# Patient Record
Sex: Female | Born: 1990
Health system: Southern US, Community
[De-identification: ages and names within clinical notes are randomized; demographics above are authoritative.]

## PROBLEM LIST (undated history)

## (undated) DIAGNOSIS — J45909 Unspecified asthma, uncomplicated: Secondary | ICD-10-CM

## (undated) HISTORY — DX: Unspecified asthma, uncomplicated: J45.909

---

## 2014-11-18 ENCOUNTER — Emergency Department (INDEPENDENT_AMBULATORY_CARE_PROVIDER_SITE_OTHER)
Admission: EM | Admit: 2014-11-18 | Discharge: 2014-11-18 | Disposition: A | Discharge status: Home / Self Care | Payer: Self-pay | Source: Home / Self Care | Attending: Emergency Medicine | Admitting: Emergency Medicine

## 2014-11-18 ENCOUNTER — Encounter (HOSPITAL_COMMUNITY): Payer: Self-pay | Admitting: Emergency Medicine

## 2014-11-18 DIAGNOSIS — M654 Radial styloid tenosynovitis [de Quervain]: Secondary | ICD-10-CM

## 2014-11-18 MED ORDER — DICLOFENAC SODIUM 1 % TD GEL: 2.0000 g | Freq: Four times a day (QID) | TRANSDERMAL | Status: DC

## 2014-11-18 MED ORDER — IBUPROFEN 600 MG PO TABS: 600.0000 mg | ORAL_TABLET | Freq: Four times a day (QID) | ORAL | Status: DC | PRN

## 2014-11-18 NOTE — Discharge Instructions (Signed)
De Quervain Tenosynovitis °Tendons attach muscles to bones. They also help with joint movements. When tendons become irritated or swollen, it is called tendinitis. °The extensor pollicis brevis (EPB) tendon connects the EPB muscle to a bone that is near the base of the thumb. The EPB muscle helps to straighten and extend the thumb. De Quervain tenosynovitis is a condition in which the EPB tendon lining (sheath) becomes irritated, thickened, and swollen. This condition is sometimes called stenosing tenosynovitis. This condition causes pain on the thumb side of the back of the wrist. °CAUSES °Causes of this condition include: °· Activities that repeatedly cause your thumb and wrist to extend. °· A sudden increase in activity or change in activity that affects your wrist. °RISK FACTORS: °This condition is more likely to develop in: °· Females. °· People who have diabetes. °· Women who have recently given birth. °· People who are over 40 years of age. °· People who do activities that involve repeated hand and wrist motions, such as tennis, racquetball, volleyball, gardening, and taking care of children. °· People who do heavy labor. °· People who have poor wrist strength and flexibility. °· People who do not warm up properly before activities. °SYMPTOMS °Symptoms of this condition include: °· Pain or tenderness over the thumb side of the back of the wrist when your thumb and wrist are not moving. °· Pain that gets worse when you straighten your thumb or extend your thumb or wrist. °· Pain when the injured area is touched. °· Locking or catching of the thumb joint while you bend and straighten your thumb. °· Decreased thumb motion due to pain. °· Swelling over the affected area. °DIAGNOSIS °This condition is diagnosed with a medical history and physical exam. Your health care provider will ask for details about your injury and ask about your symptoms. °TREATMENT °Treatment may include the use of icing and medicines to  reduce pain and swelling. You may also be advised to wear a splint or brace to limit your thumb and wrist motion. In less severe cases, treatment may also include working with a physical therapist to strengthen your wrist and calm the irritation around your EPB tendon sheath. In severe cases, surgery may be needed. °HOME CARE INSTRUCTIONS °If You Have a Splint or Brace: °· Wear it as told by your health care provider. Remove it only as told by your health care provider. °· Loosen the splint or brace if your fingers become numb and tingle, or if they turn cold and blue. °· Keep the splint or brace clean and dry. °Managing Pain, Stiffness, and Swelling  °· If directed, apply ice to the injured area. °¨ Put ice in a plastic bag. °¨ Place a towel between your skin and the bag. °¨ Leave the ice on for 20 minutes, 2-3 times per day. °· Move your fingers often to avoid stiffness and to lessen swelling. °· Raise (elevate) the injured area above the level of your heart while you are sitting or lying down. °General Instructions °· Return to your normal activities as told by your health care provider. Ask your health care provider what activities are safe for you. °· Take over-the-counter and prescription medicines only as told by your health care provider. °· Keep all follow-up visits as told by your health care provider. This is important. °· Do not drive or operate heavy machinery while taking prescription pain medicine. °SEEK MEDICAL CARE IF: °· Your pain, tenderness, or swelling gets worse, even if you have had   treatment. °· You have numbness or tingling in your wrist, hand, or fingers on the injured side. °  °This information is not intended to replace advice given to you by your health care provider. Make sure you discuss any questions you have with your health care provider. °  °Document Released: 12/19/2004 Document Revised: 09/09/2014 Document Reviewed: 02/24/2014 °Elsevier Interactive Patient Education ©2016  [employer] ° °

## 2014-11-18 NOTE — ED Provider Notes (Signed)
CSN: [id number]     Arrival date & time 11/18/14  1946 History   First MD Initiated Contact with Patient 11/18/14 2003     Chief Complaint  Patient presents with  . Wrist Pain   (Consider location/radiation/quality/duration/timing/severity/associated sxs/prior Treatment) HPI  She is a 24 year old woman here for evaluation of right wrist pain. She states this started about 2 weeks ago and has gradually been getting worse. The pain is located in the radial wrist and is worse with movements. She denies any injury or trauma. Her coworkers said it looked swollen today.  History reviewed. No pertinent past medical history. No past surgical history on file. History reviewed. No pertinent family history. Social History  Substance Use Topics  . Smoking status: None  . Smokeless tobacco: None  . Alcohol Use: None   OB History    No data available     Review of Systems As in history of present illness Allergies  Review of patient's allergies indicates no known allergies.  Home Medications   Prior to Admission medications   Medication Sig Start Date End Date Taking? Authorizing Provider  diclofenac sodium (VOLTAREN) 1 % GEL Apply 2 g topically 4 (four) times daily. 11/18/14   Charm RingsErin J Eloise Mula, MD  ibuprofen (ADVIL,MOTRIN) 600 MG tablet Take 1 tablet (600 mg total) by mouth every 6 (six) hours as needed. 11/18/14   Charm RingsErin J Mekhia Brogan, MD   Meds Ordered and Administered this Visit  Medications - No data to display  BP 124/81 mmHg  Pulse 95  Temp(Src) 98.2 F (36.8 C) (Oral)  Resp 16  SpO2 100%  LMP 11/18/2014 (Exact Date) No data found.   Physical Exam  Constitutional: She is oriented to person, place, and time. She appears well-developed and well-nourished. No distress.  Cardiovascular: Normal rate.   Pulmonary/Chest: Effort normal.  Musculoskeletal:  Right wrist: There is some swelling just distal to the radial styloid. She is tender just distal to the radial styloid. Positive  Finkelstein's. Negative Tinel's. She has full active range of motion of the wrist.  Neurological: She is alert and oriented to person, place, and time.    ED Course  Procedures (including critical care time)  Labs Review Labs Reviewed - No data to display  Imaging Review No results found.    MDM   1. De Quervain's tenosynovitis, right    Thumb spica brace given. Treat with Voltaren gel as well as ibuprofen as needed for pain. If not improving in the next week, follow-up at sports medicine for additional evaluation.    Charm RingsErin J Dayshaun Whobrey, MD 11/18/14 [id number]

## 2014-11-18 NOTE — ED Notes (Signed)
[facility] with a complaint of right wrist pain that started about 2 weeks ago and the patient denied any known trauma to the wrist.

## 2015-11-18 ENCOUNTER — Encounter: Payer: Self-pay | Admitting: Family Medicine

## 2015-11-18 ENCOUNTER — Ambulatory Visit (INDEPENDENT_AMBULATORY_CARE_PROVIDER_SITE_OTHER): Payer: Managed Care, Other (non HMO) | Admitting: Family Medicine

## 2015-11-18 VITALS — BP 120/72 | HR 98 | Temp 98.9°F | Resp 18 | Ht 62.5 in | Wt 179.0 lb

## 2015-11-18 DIAGNOSIS — H1032 Unspecified acute conjunctivitis, left eye: Secondary | ICD-10-CM

## 2015-11-18 MED ORDER — ERYTHROMYCIN 5 MG/GM OP OINT: 1.0000 "application " | TOPICAL_OINTMENT | Freq: Two times a day (BID) | OPHTHALMIC | 0 refills | Status: AC

## 2015-11-18 MED ORDER — ERYTHROMYCIN 5 MG/GM OP OINT: 1.0000 "application " | TOPICAL_OINTMENT | Freq: Two times a day (BID) | OPHTHALMIC | 0 refills | Status: DC

## 2015-11-18 NOTE — Progress Notes (Signed)
   Patient ID: Joy RabonJaquesha Detty, female    DOB: 10/09/1990, 25 y.o.   MRN: [id number]  PCP: No PCP Per Patient  Chief Complaint  Patient presents with  . Conjunctivitis    Subjective:   HPI 25 year old female presents for evaluation of left eye redness with crusting x 1 day. [facility] is [facility]. Patient reports waking up this morning with her eye completely crusted shut.  She wiped the crusting away from her left eye and noticed that it was red. Returned to sleep and awaken again to completely crusted and closed left eye. Denies sensation of a foreign body,  any itching or draining. Reports more a mild tenderness..  Social History   Social History  . Marital status: Single    Spouse name: N/A  . Number of children: N/A  . Years of education: N/A   Occupational History  . Not on file.   Social History Main Topics  . Smoking status: Never Smoker  . Smokeless tobacco: Never Used  . Alcohol use No  . Drug use: No  . Sexual activity: Not on file   Other Topics Concern  . Not on file   Social History Narrative  . No narrative on file    No family history on file.   Review of Systems See HPI There are no active problems to display for this patient.   Prior to Admission medications   Not on File   No Known Allergies     Objective:  Physical Exam  Constitutional: She is oriented to person, place, and time. She appears well-developed and well-nourished.  HENT:  Head: Normocephalic and atraumatic.  Right Ear: External ear normal.  Left Ear: External ear normal.  Nose: Nose normal.  Mouth/Throat: Oropharynx is clear and moist.  Eyes: Pupils are equal, round, and reactive to light. Left eye exhibits discharge. Left conjunctiva is injected.  Erythematous sclera. Greenish discharge noted inner canthus of left eye.  Neck: Normal range of motion. Neck supple.  Musculoskeletal: Normal range of motion.  Lymphadenopathy:    She has no cervical adenopathy.  Neurological:  She is alert and oriented to person, place, and time.  Skin: Skin is warm and dry.  Psychiatric: She has a normal mood and affect. Her behavior is normal. Judgment and thought content normal.   Vitals:   11/18/15 1045  BP: 120/72  Pulse: 98  Resp: 18  Temp: 98.9 F (37.2 C)     Assessment & Plan:  1. Acute bacterial conjunctivitis of left eye Plan:  Erythromycin (Romycin) opthalmic ointment, 1 application, 2 times daily.  Return for care as needed.  Godfrey PickKimberly S. Tiburcio PeaHarris, MSN, FNP-C [facility]

## 2015-11-18 NOTE — Patient Instructions (Addendum)
Apply 1 band of eye ointment to the left eye twice daily for 7 days.  Apply warm compresses to decrease swelling.  Wash hands frequently to prevent cross contamination to unaffected eye.    IF you received an x-ray today, you will receive an invoice from [facility]. Please contact [facility] at [phone] with questions or concerns regarding your invoice.   IF you received labwork today, you will receive an invoice from [facility]. Please contact [facility] at [phone] with questions or concerns regarding your invoice.   Our billing staff will not be able to assist you with questions regarding bills from these companies.  You will be contacted with the lab results as soon as they are available. The fastest way to get your results is to activate your My Chart account. Instructions are located on the last page of this paperwork. If you have not heard from us regarding the results in 2 weeks, please contact this office.     Bacterial Conjunctivitis Introduction Bacterial conjunctivitis is an infection of your conjunctiva. This is the clear membrane that covers the white part of your eye and the inner surface of your eyelid. This condition can make your eye:  Red or pink.  Itchy. This condition is caused by bacteria. This condition spreads very easily from person to person (is contagious) and from one eye to the other eye. Follow these instructions at home: Medicines  Take or apply your antibiotic medicine as told by your doctor. Do not stop taking or applying the antibiotic even if you start to feel better.  Take or apply over-the-counter and prescription medicines only as told by your doctor.  Do not touch your eyelid with the eye drop bottle or the ointment tube. Managing discomfort  Wipe any fluid from your eye with a warm, wet washcloth or a cotton ball.  Place a cool, clean washcloth on your eye. Do this for 10-20 minutes, 3-4  times per day. General instructions  Do not wear contact lenses until the irritation is gone. Wear glasses until your doctor says it is okay to wear contacts.  Do not wear eye makeup until your symptoms are gone. Throw away any old makeup.  Change or wash your pillowcase every day.  Do not share towels or washcloths with anyone.  Wash your hands often with soap and water. Use paper towels to dry your hands.  Do not touch or rub your eyes.  Do not drive or use heavy machinery if your vision is blurry. Contact a doctor if:  You have a fever.  Your symptoms do not get better after 10 days. Get help right away if:  You have a fever and your symptoms suddenly get worse.  You have very bad pain when you move your eye.  Your face:  Hurts.  Is red.  Is swollen.  You have sudden loss of vision. This information is not intended to replace advice given to you by your health care provider. Make sure you discuss any questions you have with your health care provider. Document Released: 09/28/2007 Document Revised: 05/27/2015 Document Reviewed: 10/01/2014  2017 Elsevier

## 2017-08-10 DIAGNOSIS — Z202 Contact with and (suspected) exposure to infections with a predominantly sexual mode of transmission: Secondary | ICD-10-CM | POA: Diagnosis not present

## 2017-08-10 DIAGNOSIS — R21 Rash and other nonspecific skin eruption: Secondary | ICD-10-CM | POA: Diagnosis not present

## 2017-08-10 DIAGNOSIS — Z3202 Encounter for pregnancy test, result negative: Secondary | ICD-10-CM | POA: Diagnosis not present

## 2018-12-02 ENCOUNTER — Other Ambulatory Visit: Payer: Self-pay

## 2018-12-02 DIAGNOSIS — Z20822 Contact with and (suspected) exposure to covid-19: Secondary | ICD-10-CM

## 2018-12-03 LAB — NOVEL CORONAVIRUS, NAA: SARS-CoV-2, NAA: NOT DETECTED

## 2019-01-24 ENCOUNTER — Ambulatory Visit: Payer: 59 | Attending: Internal Medicine

## 2019-01-24 DIAGNOSIS — Z20822 Contact with and (suspected) exposure to covid-19: Secondary | ICD-10-CM

## 2019-01-25 LAB — NOVEL CORONAVIRUS, NAA: SARS-CoV-2, NAA: DETECTED — AB

## 2019-03-21 ENCOUNTER — Ambulatory Visit: Payer: 59 | Attending: Internal Medicine

## 2019-03-21 DIAGNOSIS — Z23 Encounter for immunization: Secondary | ICD-10-CM

## 2019-03-21 NOTE — Progress Notes (Signed)
   Covid-19 Vaccination Clinic  Name:  Joy Powell    MRN: [id number] DOB: 09/10/1990  03/21/2019  [name] was observed post Covid-19 immunization for 15 minutes without incident. She was provided with Vaccine Information Sheet and instruction to access the V-Safe system.   [name] was instructed to call 911 with any severe reactions post vaccine: Marland Kitchen Difficulty breathing  . Swelling of face and throat  . A fast heartbeat  . A bad rash all over body  . Dizziness and weakness   Immunizations Administered    Name Date Dose VIS Date Route   Pfizer COVID-19 Vaccine 03/21/2019  8:40 AM 0.3 mL 12/13/2018 Intramuscular   Manufacturer: [employer], [employer]   Lot: [id number]   NDC: [address]-3

## 2019-04-15 ENCOUNTER — Ambulatory Visit: Payer: 59 | Attending: Internal Medicine

## 2019-04-15 DIAGNOSIS — Z23 Encounter for immunization: Secondary | ICD-10-CM

## 2019-04-15 NOTE — Progress Notes (Signed)
   Covid-19 Vaccination Clinic  Name:  Kai Railsback    MRN: [id number] DOB: May 31, 1990  04/15/2019  Ms. Connell was observed post Covid-19 immunization for 15 minutes without incident. She was provided with Vaccine Information Sheet and instruction to access the V-Safe system.   Ms. Hora was instructed to call 911 with any severe reactions post vaccine: Marland Kitchen Difficulty breathing  . Swelling of face and throat  . A fast heartbeat  . A bad rash all over body  . Dizziness and weakness   Immunizations Administered    Name Date Dose VIS Date Route   Pfizer COVID-19 Vaccine 04/15/2019  2:27 PM 0.3 mL 12/13/2018 Intramuscular   Manufacturer: [employer], [employer]   Lot: [id number]   NDC: [address]-2

## 2019-08-26 DIAGNOSIS — Z3009 Encounter for other general counseling and advice on contraception: Secondary | ICD-10-CM | POA: Diagnosis not present

## 2019-08-26 DIAGNOSIS — Z3202 Encounter for pregnancy test, result negative: Secondary | ICD-10-CM | POA: Diagnosis not present

## 2019-08-26 DIAGNOSIS — R69 Illness, unspecified: Secondary | ICD-10-CM | POA: Diagnosis not present

## 2020-01-04 DIAGNOSIS — Z20822 Contact with and (suspected) exposure to covid-19: Secondary | ICD-10-CM | POA: Diagnosis not present

## 2020-01-07 DIAGNOSIS — Z20822 Contact with and (suspected) exposure to covid-19: Secondary | ICD-10-CM | POA: Diagnosis not present

## 2020-03-26 DIAGNOSIS — R69 Illness, unspecified: Secondary | ICD-10-CM | POA: Diagnosis not present

## 2020-03-26 DIAGNOSIS — Z01419 Encounter for gynecological examination (general) (routine) without abnormal findings: Secondary | ICD-10-CM | POA: Diagnosis not present

## 2020-03-26 DIAGNOSIS — Z113 Encounter for screening for infections with a predominantly sexual mode of transmission: Secondary | ICD-10-CM | POA: Diagnosis not present

## 2021-04-04 ENCOUNTER — Other Ambulatory Visit: Payer: Self-pay | Admitting: Obstetrics and Gynecology

## 2021-04-04 DIAGNOSIS — N6320 Unspecified lump in the left breast, unspecified quadrant: Secondary | ICD-10-CM

## 2021-04-06 ENCOUNTER — Ambulatory Visit
Admission: RE | Admit: 2021-04-06 | Discharge: 2021-04-06 | Disposition: A | Discharge status: Home / Self Care | Payer: 59 | Source: Ambulatory Visit | Attending: Obstetrics and Gynecology | Admitting: Obstetrics and Gynecology

## 2021-04-06 DIAGNOSIS — N6320 Unspecified lump in the left breast, unspecified quadrant: Secondary | ICD-10-CM

## 2023-03-27 IMAGING — US US BREAST*L* LIMITED INC AXILLA
1 series · 14 of 16 positions shown · non-contrast
Comparison: None.

CLINICAL DATA: Palpable lump within the periareolar LEFT breast.

EXAM:
ULTRASOUND OF THE LEFT BREAST

[Series 1: us breast*left* limited inc axilla · 0.06mm/px · 14 of 16 slices shown]
[im 1/16]
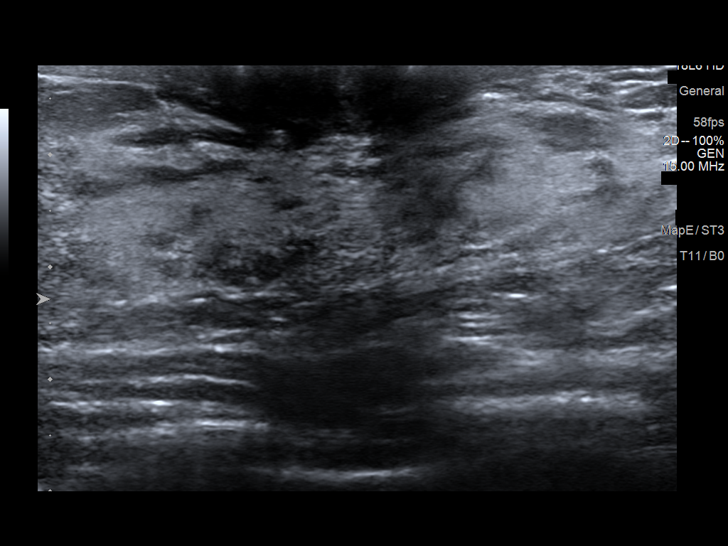
[im 2/16]
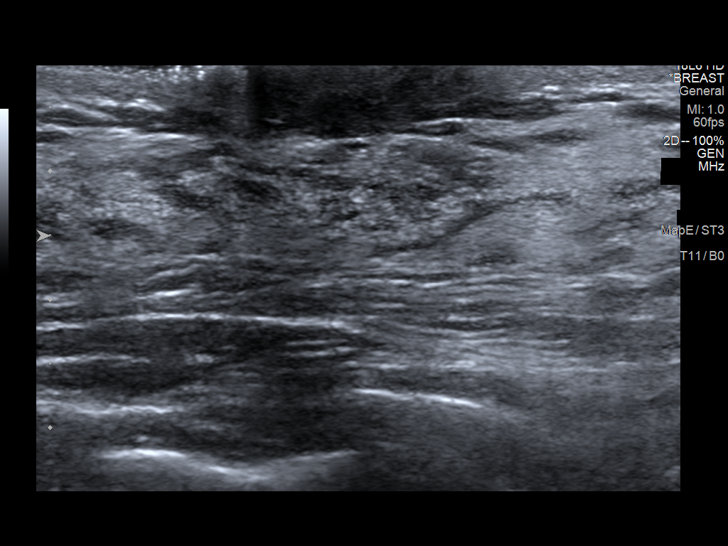
[im 3/16]
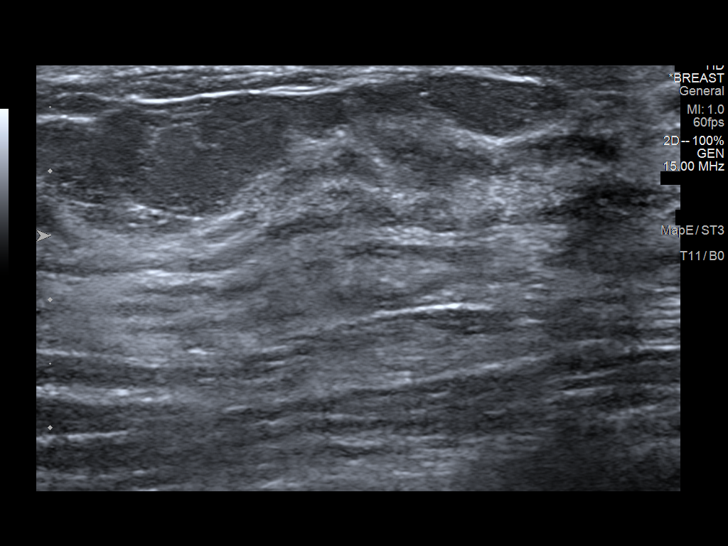
[im 5/16]
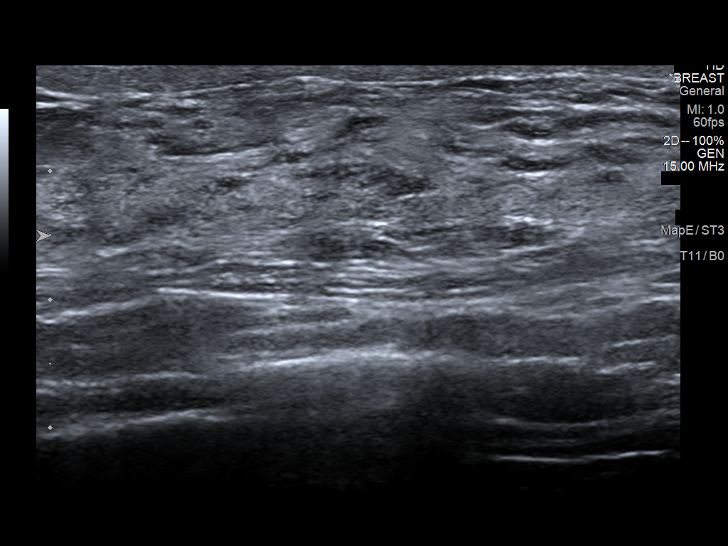
[im 6/16]
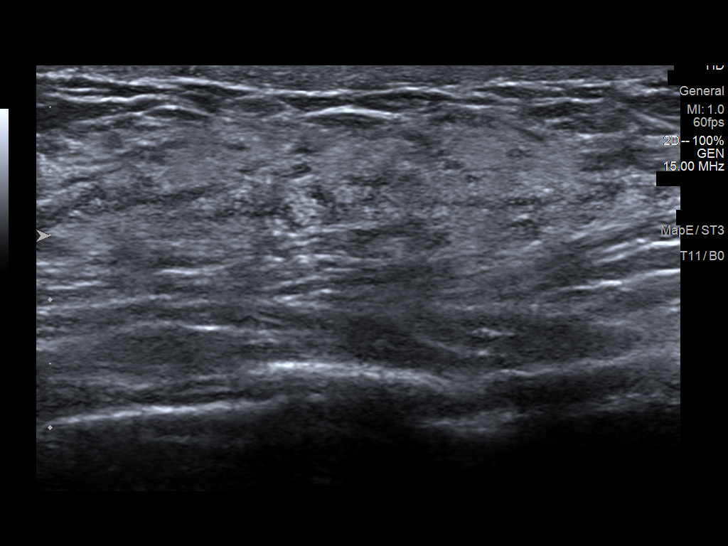
[im 7/16]
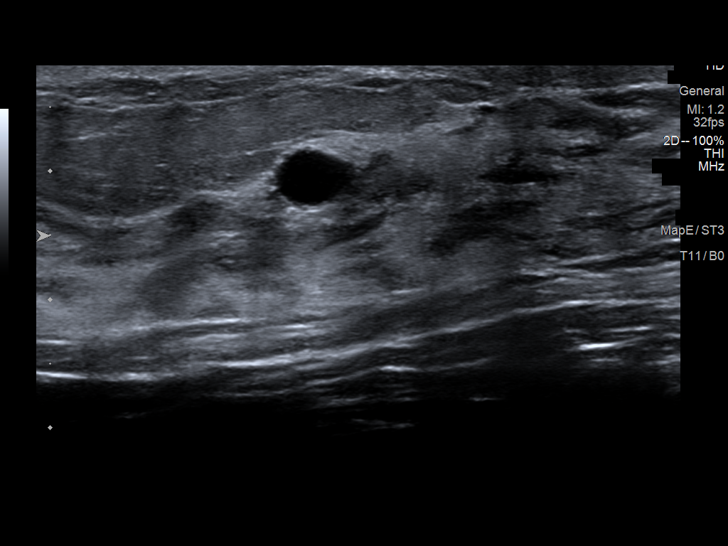
[im 8/16]
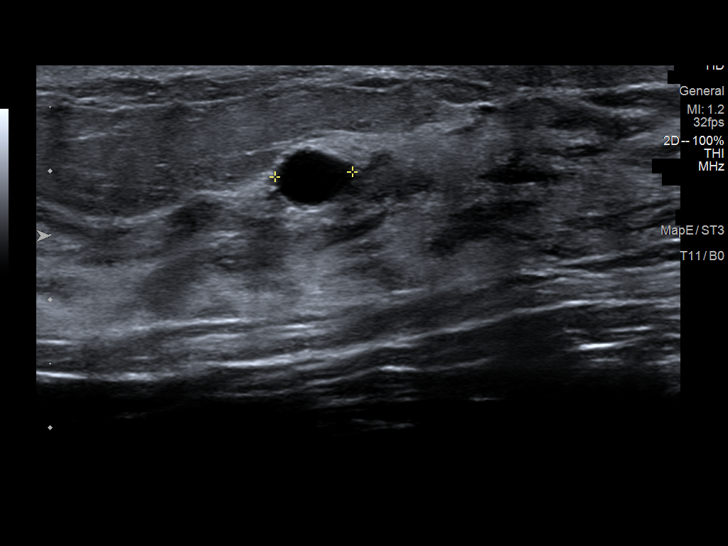
[im 9/16]
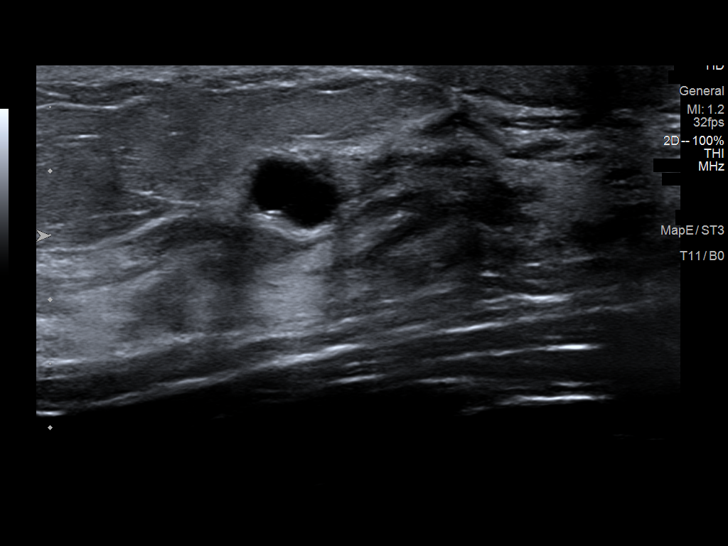
[im 10/16]
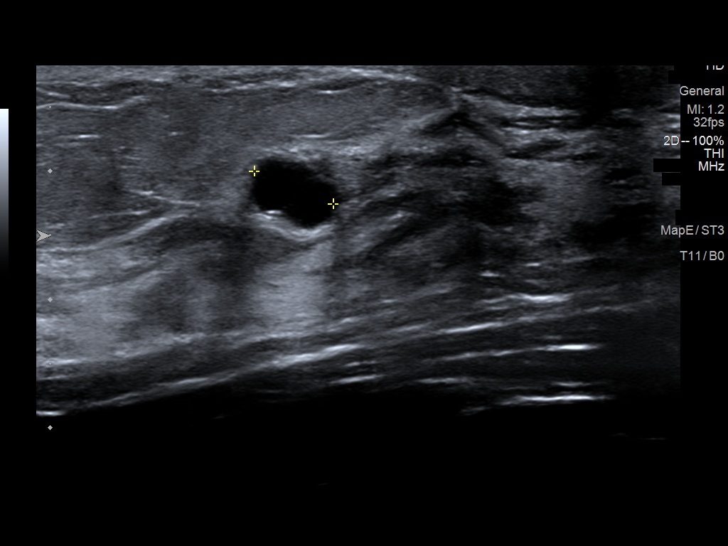
[im 11/16]
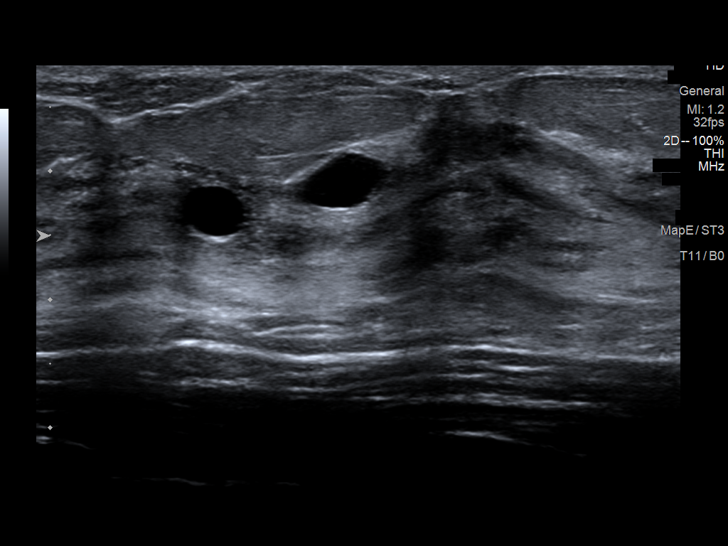
[im 13/16]
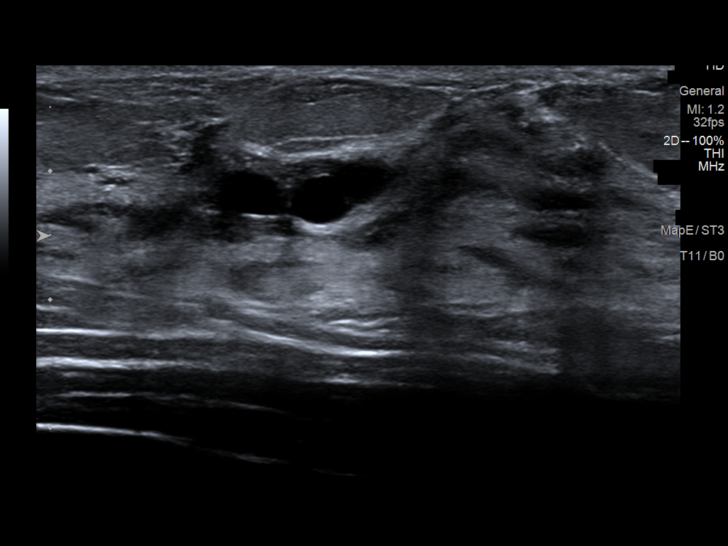
[im 14/16]
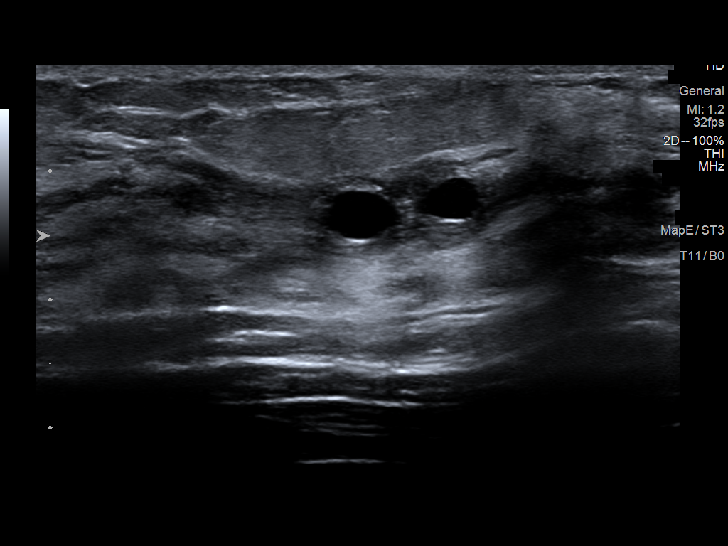
[im 15/16]
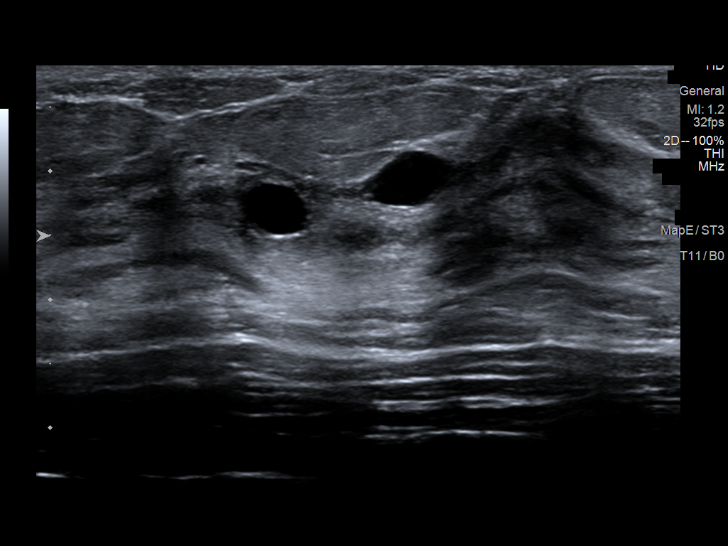
[im 16/16]
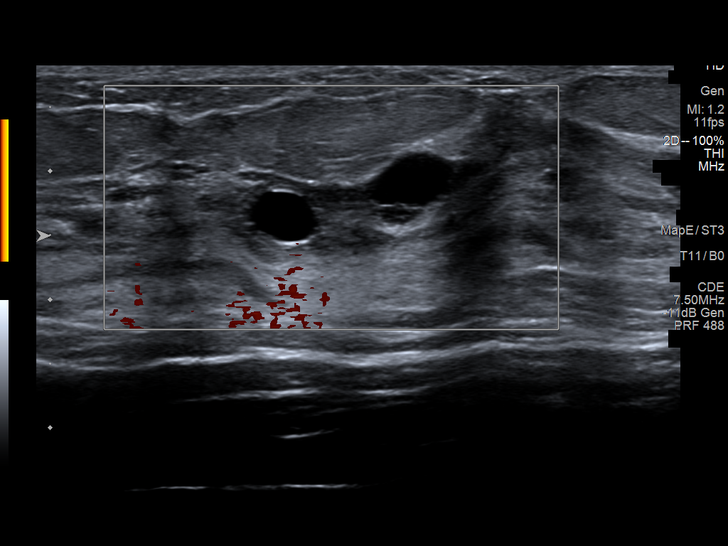

[14 of 16 positions shown; findings below may reference images not displayed]

FINDINGS: Targeted ultrasound is performed, evaluating the subareolar and
periareolar LEFT breast as directed by the patient, showing multiple
adjacent simple cysts within the LEFT breast at the 11 o'clock axis,
1 cm from the nipple, largest component measuring 8 mm greatest
dimension. No suspicious findings.
IMPRESSION: No evidence of malignancy. Benign cysts within the LEFT breast at
the 11 o'clock axis, largest measuring 8 mm, corresponding to the
area of clinical concern.

RECOMMENDATION:
1. Screening mammogram at age 42 unless there are persistent or
intervening clinical concerns. (Code:[id number])
2. The patient was instructed to return sooner if the area that she
feels becomes larger and/or firmer to palpation, or if a new
palpable abnormality is identified in either breast.

I have discussed the findings and recommendations with the patient.
If applicable, a reminder letter will be sent to the patient
regarding the next appointment.

BI-RADS CATEGORY  2: Benign.
